# Patient Record
Sex: Female | Born: 1990 | Race: White | Hispanic: No | Marital: Married | Smoking: Former smoker
Health system: Southern US, Community
[De-identification: ages and names within clinical notes are randomized; demographics above are authoritative.]

## PROBLEM LIST (undated history)

## (undated) DIAGNOSIS — Z789 Other specified health status: Secondary | ICD-10-CM

## (undated) HISTORY — PX: NO PAST SURGERIES: SHX2092

---

## 2013-10-17 LAB — OB RESULTS CONSOLE HEPATITIS B SURFACE ANTIGEN: Hepatitis B Surface Ag: NEGATIVE

## 2013-10-17 LAB — OB RESULTS CONSOLE ABO/RH: RH Type: POSITIVE

## 2013-10-17 LAB — OB RESULTS CONSOLE GC/CHLAMYDIA
Chlamydia: NEGATIVE
GC PROBE AMP, GENITAL: NEGATIVE

## 2013-10-17 LAB — OB RESULTS CONSOLE RPR: RPR: NONREACTIVE

## 2013-10-17 LAB — OB RESULTS CONSOLE HIV ANTIBODY (ROUTINE TESTING): HIV: NONREACTIVE

## 2013-10-17 LAB — OB RESULTS CONSOLE RUBELLA ANTIBODY, IGM: Rubella: IMMUNE

## 2014-01-17 NOTE — L&D Delivery Note (Signed)
Operative Delivery Note At 6:38 PM a healthy female was delivered via Vaginal, Vacuum Investment banker, operational(Extractor).  Presentation: vertex; Position: Right,, Occiput,, Anterior; Station: +3.  The patient reached complete dilation and pushed about 2 hours then began to make no significant progress due to fatigue.  Verbal consent: obtained from patient.  Risks and benefits discussed in detail.  Risks include, but are not limited to the risks of anesthesia, bleeding, infection, damage to maternal tissues, fetal cephalhematoma.  There is also the risk of inability to effect vaginal delivery of the head, or shoulder dystocia that cannot be resolved by established maneuvers, leading to the need for emergency cesarean section.  The softcup vacuum was applied to the green zone and over 3 sets of pushes the vertex delivered to crowning with one pop-off.  The patient then pushed out remainder of head.  APGAR: 9, 9; weight pendiung .   Placenta status: Intact, Spontaneous.   Cord: 3 vessels with the following complications: None.    Anesthesia: Epidural  Instruments: Soft cup vacuum Episiotomy:  none Lacerations: Perineal;Right Labial;2nd degree Suture Repair: 3.0 vicryl rapide Est. Blood Loss (mL): 273mL  Mom to postpartum.  Baby to Couplet care / Skin to Skin.  Leah Hudson,Leah Hudson 05/09/2014, 7:07 PM

## 2014-04-16 LAB — OB RESULTS CONSOLE GBS: GBS: POSITIVE

## 2014-05-08 ENCOUNTER — Encounter (HOSPITAL_COMMUNITY): Payer: Self-pay | Admitting: *Deleted

## 2014-05-08 ENCOUNTER — Inpatient Hospital Stay (HOSPITAL_COMMUNITY)
Admission: AD | Admit: 2014-05-08 | Discharge: 2014-05-11 | DRG: 775 | Disposition: A | Discharge status: Home / Self Care | Payer: No Typology Code available for payment source | Source: Ambulatory Visit | Attending: Obstetrics and Gynecology | Admitting: Obstetrics and Gynecology

## 2014-05-08 DIAGNOSIS — Z3A39 39 weeks gestation of pregnancy: Secondary | ICD-10-CM | POA: Diagnosis present

## 2014-05-08 DIAGNOSIS — Z3490 Encounter for supervision of normal pregnancy, unspecified, unspecified trimester: Secondary | ICD-10-CM

## 2014-05-08 DIAGNOSIS — Z833 Family history of diabetes mellitus: Secondary | ICD-10-CM

## 2014-05-08 DIAGNOSIS — O99824 Streptococcus B carrier state complicating childbirth: Secondary | ICD-10-CM | POA: Diagnosis present

## 2014-05-08 DIAGNOSIS — O133 Gestational [pregnancy-induced] hypertension without significant proteinuria, third trimester: Principal | ICD-10-CM | POA: Diagnosis present

## 2014-05-08 DIAGNOSIS — Z87891 Personal history of nicotine dependence: Secondary | ICD-10-CM

## 2014-05-08 HISTORY — DX: Other specified health status: Z78.9

## 2014-05-08 LAB — COMPREHENSIVE METABOLIC PANEL
ALBUMIN: 2.8 g/dL — AB (ref 3.5–5.2)
ALK PHOS: 181 U/L — AB (ref 39–117)
ALT: 17 U/L (ref 0–35)
AST: 25 U/L (ref 0–37)
Anion gap: 9 (ref 5–15)
BUN: 9 mg/dL (ref 6–23)
CO2: 17 mmol/L — ABNORMAL LOW (ref 19–32)
Calcium: 8.8 mg/dL (ref 8.4–10.5)
Chloride: 108 mmol/L (ref 96–112)
Creatinine, Ser: 0.56 mg/dL (ref 0.50–1.10)
GFR calc Af Amer: >90 mL/min (ref >90)
GFR calc non Af Amer: >90 mL/min (ref >90)
Glucose, Bld: 94 mg/dL (ref 70–99)
POTASSIUM: 4 mmol/L (ref 3.5–5.1)
SODIUM: 134 mmol/L — AB (ref 135–145)
Total Bilirubin: 0.5 mg/dL (ref 0.3–1.2)
Total Protein: 6.3 g/dL (ref 6.0–8.3)

## 2014-05-08 LAB — TYPE AND SCREEN
ABO/RH(D): A POS
ANTIBODY SCREEN: NEGATIVE

## 2014-05-08 LAB — CBC
HCT: 35.4 % — ABNORMAL LOW (ref 36.0–46.0)
HEMOGLOBIN: 12 g/dL (ref 12.0–15.0)
MCH: 29.3 pg (ref 26.0–34.0)
MCHC: 33.9 g/dL (ref 30.0–36.0)
MCV: 86.6 fL (ref 78.0–100.0)
Platelets: 207 10*3/uL (ref 150–400)
RBC: 4.09 MIL/uL (ref 3.87–5.11)
RDW: 14.3 % (ref 11.5–15.5)
WBC: 8.4 10*3/uL (ref 4.0–10.5)

## 2014-05-08 LAB — ABO/RH: ABO/RH(D): A POS

## 2014-05-08 MED ORDER — MISOPROSTOL 25 MCG QUARTER TABLET
25.0000 ug | ORAL_TABLET | ORAL | Status: DC | PRN
  Administered 2014-05-08 (×2): 25 ug via VAGINAL
  Filled 2014-05-08: qty 0.25
  Filled 2014-05-08: qty 1
  Filled 2014-05-08: qty 0.25

## 2014-05-08 MED ORDER — CITRIC ACID-SODIUM CITRATE 334-500 MG/5ML PO SOLN: 30.0000 mL | ORAL | Status: DC | PRN

## 2014-05-08 MED ORDER — OXYTOCIN 40 UNITS IN LACTATED RINGERS INFUSION - SIMPLE MED
1.0000 m[IU]/min | INTRAVENOUS | Status: DC
  Administered 2014-05-08: 1 m[IU]/min via INTRAVENOUS
  Filled 2014-05-08: qty 1000

## 2014-05-08 MED ORDER — ONDANSETRON HCL 4 MG/2ML IJ SOLN: 4.0000 mg | Freq: Four times a day (QID) | INTRAMUSCULAR | Status: DC | PRN

## 2014-05-08 MED ORDER — OXYTOCIN 40 UNITS IN LACTATED RINGERS INFUSION - SIMPLE MED: 62.5000 mL/h | INTRAVENOUS | Status: DC

## 2014-05-08 MED ORDER — TERBUTALINE SULFATE 1 MG/ML IJ SOLN
0.2500 mg | Freq: Once | INTRAMUSCULAR | Status: AC | PRN
Start: 2014-05-08 — End: 2014-05-08

## 2014-05-08 MED ORDER — OXYCODONE-ACETAMINOPHEN 5-325 MG PO TABS
1.0000 | ORAL_TABLET | ORAL | Status: DC | PRN
Start: 2014-05-08 — End: 2014-05-09

## 2014-05-08 MED ORDER — ACETAMINOPHEN 325 MG PO TABS: 650.0000 mg | ORAL_TABLET | ORAL | Status: DC | PRN

## 2014-05-08 MED ORDER — FLEET ENEMA 7-19 GM/118ML RE ENEM: 1.0000 | ENEMA | RECTAL | Status: DC | PRN

## 2014-05-08 MED ORDER — LACTATED RINGERS IV SOLN
INTRAVENOUS | Status: DC
Start: 2014-05-08 — End: 2014-05-08
  Administered 2014-05-08: 12:00:00 via INTRAVENOUS

## 2014-05-08 MED ORDER — TERBUTALINE SULFATE 1 MG/ML IJ SOLN: 0.2500 mg | Freq: Once | INTRAMUSCULAR | Status: AC | PRN

## 2014-05-08 MED ORDER — LACTATED RINGERS IV SOLN: 500.0000 mL | INTRAVENOUS | Status: DC | PRN

## 2014-05-08 MED ORDER — LACTATED RINGERS IV SOLN
INTRAVENOUS | Status: DC
  Administered 2014-05-08 – 2014-05-09 (×3): via INTRAVENOUS

## 2014-05-08 MED ORDER — LIDOCAINE HCL (PF) 1 % IJ SOLN: 30.0000 mL | INTRAMUSCULAR | Status: DC | PRN

## 2014-05-08 MED ORDER — OXYTOCIN BOLUS FROM INFUSION: 500.0000 mL | INTRAVENOUS | Status: DC

## 2014-05-08 MED ORDER — LABETALOL HCL 5 MG/ML IV SOLN: 20.0000 mg | INTRAVENOUS | Status: DC | PRN

## 2014-05-08 MED ORDER — OXYCODONE-ACETAMINOPHEN 5-325 MG PO TABS: 1.0000 | ORAL_TABLET | ORAL | Status: DC | PRN

## 2014-05-08 MED ORDER — OXYCODONE-ACETAMINOPHEN 5-325 MG PO TABS: 2.0000 | ORAL_TABLET | ORAL | Status: DC | PRN

## 2014-05-08 MED ORDER — BUTORPHANOL TARTRATE 1 MG/ML IJ SOLN
1.0000 mg | INTRAMUSCULAR | Status: DC | PRN
  Administered 2014-05-08 (×2): 1 mg via INTRAVENOUS
  Filled 2014-05-08 (×2): qty 1

## 2014-05-08 MED ORDER — DEXTROSE 5 % IV SOLN
2.5000 10*6.[IU] | INTRAVENOUS | Status: DC
  Administered 2014-05-08 – 2014-05-09 (×6): 2.5 10*6.[IU] via INTRAVENOUS
  Filled 2014-05-08 (×10): qty 2.5

## 2014-05-08 MED ORDER — HYDRALAZINE HCL 20 MG/ML IJ SOLN: 10.0000 mg | Freq: Once | INTRAMUSCULAR | Status: AC | PRN

## 2014-05-08 MED ORDER — PENICILLIN G POTASSIUM 5000000 UNITS IJ SOLR
5.0000 10*6.[IU] | Freq: Once | INTRAVENOUS | Status: AC
  Administered 2014-05-08: 5 10*6.[IU] via INTRAVENOUS
  Filled 2014-05-08: qty 5

## 2014-05-08 NOTE — Progress Notes (Signed)
Cytotec placed, cervix unchanged, foley in place

## 2014-05-08 NOTE — H&P (Signed)
Leah Hudson is a 24 y.o. female, G1P0, EGA 39+ weeks with EDC 4-27 presenting for ripening and induction for PIH.  Prenatal care uncomplicated until BP yesterday 150/90 x 2, no proteinuria.  Seen again today, BP 160/90.  No PIH symptoms, pregnancy otherwise uncomplicated, see prenatal records for complete history.  Maternal Medical History:  Fetal activity: Perceived fetal activity is normal.    Prenatal complications: PIH.     OB History    Gravida Para Term Preterm AB TAB SAB Ectopic Multiple Living   1              Past Medical History  Diagnosis Date  . Medical history non-contributory    Past Surgical History  Procedure Laterality Date  . No past surgeries     Family History: family history includes Diabetes in her maternal grandmother; Varicose Veins in her maternal grandmother and mother. Social History:  reports that she has quit smoking. Her smoking use included Cigarettes. She has a 1.5 pack-year smoking history. She does not have any smokeless tobacco history on file. She reports that she does not drink alcohol or use illicit drugs.   Prenatal Transfer Tool  Maternal Diabetes: No Genetic Screening: Declined Maternal Ultrasounds/Referrals: Normal Fetal Ultrasounds or other Referrals:  None Maternal Substance Abuse:  No Significant Maternal Medications:  None Significant Maternal Lab Results:  Lab values include: Group B Strep positive Other Comments:  PIH  Review of Systems  Respiratory: Negative.   Cardiovascular: Negative.       Blood pressure 148/82, pulse 92, temperature 98.5 F (36.9 C), temperature source Oral, resp. rate 18, height 5\' 8"  (1.727 m), weight 115.214 kg (254 lb). Maternal Exam:  Abdomen: Patient reports no abdominal tenderness. Estimated fetal weight is 7-8 lbs.   Fetal presentation: vertex  Introitus: Normal vulva. Normal vagina.  Amniotic fluid character: not assessed.  Pelvis: adequate for delivery.   Cervix: Cervix evaluated by  digital exam.     Fetal Exam Fetal Monitor Review: Mode: ultrasound.   Baseline rate: 140.  Variability: moderate (6-25 bpm).   Pattern: accelerations present and no decelerations.    Fetal State Assessment: Category I - tracings are normal.     Physical Exam  Vitals reviewed. Constitutional: She appears well-developed and well-nourished.  Cardiovascular: Normal rate, regular rhythm and normal heart sounds.   No murmur heard. Respiratory: Effort normal and breath sounds normal. No respiratory distress. She has no wheezes.  GI: Soft.   VE-FT/50/-3, vtx, foley placed through cervix and bulb inflated with 60 cc fluid  Prenatal labs: ABO, Rh:  A pos Antibody:  neg Rubella:  Immune RPR:   NR HBsAg:   Neg HIV:   NR GBS:   Pos GCT:  149, nl GTT  Assessment/Plan: IUP at 39 weeks with PIH, unfavorable cervix.  Will check PIH labs, foley placed in cervix and will do cytotec for ripening, monitor BP and treat if needed.   Leah Hudson D 05/08/2014, 12:33 PM

## 2014-05-08 NOTE — Progress Notes (Signed)
Feeling some cramping Afeb, VSS, BP 130-140/80-90 FHT- Cat I Labs all normal Continue ripening

## 2014-05-09 ENCOUNTER — Encounter (HOSPITAL_COMMUNITY): Payer: Self-pay

## 2014-05-09 ENCOUNTER — Inpatient Hospital Stay (HOSPITAL_COMMUNITY): Payer: No Typology Code available for payment source | Admitting: Anesthesiology

## 2014-05-09 LAB — CBC
HCT: 34.2 % — ABNORMAL LOW (ref 36.0–46.0)
HEMATOCRIT: 35.8 % — AB (ref 36.0–46.0)
HEMOGLOBIN: 11.7 g/dL — AB (ref 12.0–15.0)
HEMOGLOBIN: 12 g/dL (ref 12.0–15.0)
MCH: 29.8 pg (ref 26.0–34.0)
MCH: 29.1 pg (ref 26.0–34.0)
MCHC: 34.2 g/dL (ref 30.0–36.0)
MCHC: 33.5 g/dL (ref 30.0–36.0)
MCV: 87 fL (ref 78.0–100.0)
MCV: 86.7 fL (ref 78.0–100.0)
Platelets: 183 10*3/uL (ref 150–400)
Platelets: 190 10*3/uL (ref 150–400)
RBC: 3.93 MIL/uL (ref 3.87–5.11)
RBC: 4.13 MIL/uL (ref 3.87–5.11)
RDW: 14.4 % (ref 11.5–15.5)
RDW: 14.2 % (ref 11.5–15.5)
WBC: 9 10*3/uL (ref 4.0–10.5)
WBC: 11.7 10*3/uL — ABNORMAL HIGH (ref 4.0–10.5)

## 2014-05-09 LAB — RPR: RPR: NONREACTIVE

## 2014-05-09 MED ORDER — SENNOSIDES-DOCUSATE SODIUM 8.6-50 MG PO TABS
2.0000 | ORAL_TABLET | ORAL | Status: DC
  Administered 2014-05-09 – 2014-05-11 (×2): 2 via ORAL
  Filled 2014-05-09 (×2): qty 2

## 2014-05-09 MED ORDER — EPHEDRINE 5 MG/ML INJ
10.0000 mg | INTRAVENOUS | Status: DC | PRN
  Filled 2014-05-09: qty 2

## 2014-05-09 MED ORDER — OXYTOCIN 40 UNITS IN LACTATED RINGERS INFUSION - SIMPLE MED
1.0000 m[IU]/min | INTRAVENOUS | Status: DC
  Administered 2014-05-09: 666 m[IU]/min via INTRAVENOUS

## 2014-05-09 MED ORDER — PRENATAL MULTIVITAMIN CH
1.0000 | ORAL_TABLET | Freq: Every day | ORAL | Status: DC
  Administered 2014-05-10: 1 via ORAL
  Filled 2014-05-09: qty 1

## 2014-05-09 MED ORDER — LIDOCAINE HCL (PF) 1 % IJ SOLN
INTRAMUSCULAR | Status: AC
  Filled 2014-05-09: qty 30

## 2014-05-09 MED ORDER — ONDANSETRON HCL 4 MG PO TABS
4.0000 mg | ORAL_TABLET | ORAL | Status: DC | PRN
Start: 2014-05-09 — End: 2014-05-11

## 2014-05-09 MED ORDER — ONDANSETRON HCL 4 MG/2ML IJ SOLN: 4.0000 mg | INTRAMUSCULAR | Status: DC | PRN

## 2014-05-09 MED ORDER — LORATADINE 10 MG PO TABS
10.0000 mg | ORAL_TABLET | Freq: Every day | ORAL | Status: DC
  Administered 2014-05-11: 10 mg via ORAL
  Filled 2014-05-09: qty 1

## 2014-05-09 MED ORDER — LANOLIN HYDROUS EX OINT
TOPICAL_OINTMENT | CUTANEOUS | Status: DC | PRN
Start: 2014-05-09 — End: 2014-05-11

## 2014-05-09 MED ORDER — ACETAMINOPHEN 325 MG PO TABS: 650.0000 mg | ORAL_TABLET | ORAL | Status: DC | PRN

## 2014-05-09 MED ORDER — SIMETHICONE 80 MG PO CHEW: 80.0000 mg | CHEWABLE_TABLET | ORAL | Status: DC | PRN

## 2014-05-09 MED ORDER — TETANUS-DIPHTH-ACELL PERTUSSIS 5-2.5-18.5 LF-MCG/0.5 IM SUSP: 0.5000 mL | Freq: Once | INTRAMUSCULAR | Status: DC

## 2014-05-09 MED ORDER — DIBUCAINE 1 % RE OINT: 1.0000 "application " | TOPICAL_OINTMENT | RECTAL | Status: DC | PRN

## 2014-05-09 MED ORDER — IBUPROFEN 600 MG PO TABS
600.0000 mg | ORAL_TABLET | Freq: Four times a day (QID) | ORAL | Status: DC
  Administered 2014-05-09 – 2014-05-11 (×6): 600 mg via ORAL
  Filled 2014-05-09 (×6): qty 1

## 2014-05-09 MED ORDER — LIDOCAINE HCL (PF) 1 % IJ SOLN
INTRAMUSCULAR | Status: DC | PRN
  Administered 2014-05-09 (×2): 4 mL

## 2014-05-09 MED ORDER — ZOLPIDEM TARTRATE 5 MG PO TABS: 5.0000 mg | ORAL_TABLET | Freq: Every evening | ORAL | Status: DC | PRN

## 2014-05-09 MED ORDER — PHENYLEPHRINE 40 MCG/ML (10ML) SYRINGE FOR IV PUSH (FOR BLOOD PRESSURE SUPPORT)
80.0000 ug | PREFILLED_SYRINGE | INTRAVENOUS | Status: DC | PRN
  Filled 2014-05-09: qty 2

## 2014-05-09 MED ORDER — PHENYLEPHRINE 40 MCG/ML (10ML) SYRINGE FOR IV PUSH (FOR BLOOD PRESSURE SUPPORT)
80.0000 ug | PREFILLED_SYRINGE | INTRAVENOUS | Status: DC | PRN
  Filled 2014-05-09: qty 20
  Filled 2014-05-09: qty 2

## 2014-05-09 MED ORDER — DIPHENHYDRAMINE HCL 50 MG/ML IJ SOLN: 12.5000 mg | INTRAMUSCULAR | Status: DC | PRN

## 2014-05-09 MED ORDER — OXYCODONE-ACETAMINOPHEN 5-325 MG PO TABS: 2.0000 | ORAL_TABLET | ORAL | Status: DC | PRN

## 2014-05-09 MED ORDER — OXYCODONE-ACETAMINOPHEN 5-325 MG PO TABS: 1.0000 | ORAL_TABLET | ORAL | Status: DC | PRN

## 2014-05-09 MED ORDER — BENZOCAINE-MENTHOL 20-0.5 % EX AERO
1.0000 "application " | INHALATION_SPRAY | CUTANEOUS | Status: DC | PRN
  Administered 2014-05-10: 1 via TOPICAL
  Filled 2014-05-09: qty 56

## 2014-05-09 MED ORDER — LACTATED RINGERS IV SOLN: 500.0000 mL | Freq: Once | INTRAVENOUS | Status: DC

## 2014-05-09 MED ORDER — DIPHENHYDRAMINE HCL 25 MG PO CAPS: 25.0000 mg | ORAL_CAPSULE | Freq: Four times a day (QID) | ORAL | Status: DC | PRN

## 2014-05-09 MED ORDER — WITCH HAZEL-GLYCERIN EX PADS: 1.0000 "application " | MEDICATED_PAD | CUTANEOUS | Status: DC | PRN

## 2014-05-09 MED ORDER — FENTANYL 2.5 MCG/ML BUPIVACAINE 1/10 % EPIDURAL INFUSION (WH - ANES)
14.0000 mL/h | INTRAMUSCULAR | Status: DC | PRN
  Administered 2014-05-09 (×2): 14 mL/h via EPIDURAL
  Filled 2014-05-09 (×2): qty 125

## 2014-05-09 NOTE — Anesthesia Procedure Notes (Signed)
Epidural Patient location during procedure: OB Start time: 05/09/2014 9:45 AM  Staffing Anesthesiologist: Karie SchwalbeJUDD, Tyshon Fanning Performed by: anesthesiologist   Preanesthetic Checklist Completed: patient identified, site marked, surgical consent, pre-op evaluation, timeout performed, IV checked, risks and benefits discussed and monitors and equipment checked  Epidural Patient position: sitting Prep: site prepped and draped and DuraPrep Patient monitoring: continuous pulse ox and blood pressure Approach: midline Location: L3-L4 Injection technique: LOR saline  Needle:  Needle type: Tuohy  Needle gauge: 17 G Needle length: 9 cm and 9 Needle insertion depth: 9 cm Catheter type: closed end flexible Catheter size: 19 Gauge Catheter at skin depth: 14 cm Test dose: negative  Assessment Events: blood not aspirated, injection not painful, no injection resistance, negative IV test and no paresthesia  Additional Notes Patient identified. Risks/Benefits/Options discussed with patient including but not limited to bleeding, infection, nerve damage, paralysis, failed block, incomplete pain control, headache, blood pressure changes, nausea, vomiting, reactions to medication both or allergic, itching and postpartum back pain. Confirmed with bedside nurse the patient's most recent platelet count. Confirmed with patient that they are not currently taking any anticoagulation, have any bleeding history or any family history of bleeding disorders. Patient expressed understanding and wished to proceed. All questions were answered. Sterile technique was used throughout the entire procedure. Please see nursing notes for vital signs. Test dose was given through epidural catheter and negative prior to continuing to dose epidural or start infusion. Warning signs of high block given to the patient including shortness of breath, tingling/numbness in hands, complete motor block, or any concerning symptoms with instructions to  call for help. Patient was given instructions on fall risk and not to get out of bed. All questions and concerns addressed with instructions to call with any issues or inadequate analgesia.

## 2014-05-09 NOTE — Progress Notes (Signed)
Patient ID: Denton MeekJessica L Hudson, female   DOB: 09-Nov-1990, 24 y.o.   MRN: [id number] Pt received low dose pitocin all night, currently at 12 mu with only mild contractions afeb BP essentially WNL with a few 140/90 FHR category 1 Cervix 50/4/-2 AROM clear  D/w pt epidural when uncomfortable Has received multiple doses of PCN for GBS + status overnight. Will follow progress.

## 2014-05-09 NOTE — Anesthesia Preprocedure Evaluation (Signed)
Anesthesia Evaluation  Patient identified by MRN, date of birth, ID band Patient awake    Reviewed: Allergy & Precautions, NPO status , Patient's Chart, lab work & pertinent test results  Airway Mallampati: III  TM Distance: >3 FB Neck ROM: Full    Dental no notable dental hx. (+) Dental Advisory Given   Pulmonary former smoker,  breath sounds clear to auscultation  Pulmonary exam normal       Cardiovascular hypertension, Rhythm:Regular Rate:Normal     Neuro/Psych negative neurological ROS  negative psych ROS   GI/Hepatic negative GI ROS, Neg liver ROS,   Endo/Other  negative endocrine ROSObesity   Renal/GU negative Renal ROS  negative genitourinary   Musculoskeletal negative musculoskeletal ROS (+)   Abdominal   Peds negative pediatric ROS (+)  Hematology negative hematology ROS (+)   Anesthesia Other Findings   Reproductive/Obstetrics negative OB ROS                             Anesthesia Physical Anesthesia Plan  ASA: II  Anesthesia Plan: Epidural   Post-op Pain Management:    Induction:   Airway Management Planned:   Additional Equipment:   Intra-op Plan:   Post-operative Plan:   Informed Consent: I have reviewed the patients History and Physical, chart, labs and discussed the procedure including the risks, benefits and alternatives for the proposed anesthesia with the patient or authorized representative who has indicated his/her understanding and acceptance.   Dental advisory given  Plan Discussed with:   Anesthesia Plan Comments:         Anesthesia Quick Evaluation

## 2014-05-09 NOTE — Progress Notes (Signed)
Patient ID: Leah MeekJessica L Ripple, female   DOB: [birth year], 24 y.o.   MRN: [id number] Pt comfortable with epidural overall, a bit of pain in right lower back--but laying on left afeb vss FHR category  Cervix 80/4-5/-2  IUPC placed to adjust pitocin Pitocin at 17 mu Will increase as needed to get adequate.

## 2014-05-10 LAB — CBC
HCT: 32.7 % — ABNORMAL LOW (ref 36.0–46.0)
HEMOGLOBIN: 11.2 g/dL — AB (ref 12.0–15.0)
MCH: 29.5 pg (ref 26.0–34.0)
MCHC: 34.3 g/dL (ref 30.0–36.0)
MCV: 86.1 fL (ref 78.0–100.0)
PLATELETS: 185 10*3/uL (ref 150–400)
RBC: 3.8 MIL/uL — ABNORMAL LOW (ref 3.87–5.11)
RDW: 14.4 % (ref 11.5–15.5)
WBC: 11.6 10*3/uL — ABNORMAL HIGH (ref 4.0–10.5)

## 2014-05-10 NOTE — Lactation Note (Signed)
This note was copied from the chart of Leah Hudson. Lactation Consultation Note  Assisted mother w/ football hold.  Mother has good positioning but baby is spitty and would not sustain latch. Mother had good positioning.  Encouraged her to try again when baby cues. Mother was able to independently hand express drops of colostrum. Discussed milk storage, STS, supply, demand, cluster feeding, how to unlatch, and pacifiers. Mom encouraged to feed baby 8-12 times/24 hours and with feeding cues.  Mom made aware of O/P services, breastfeeding support groups, community resources, and our phone # for post-discharge questions.    Patient Name: Leah Joylene JohnJessica Hudson [id number]'s Date: 05/10/2014 Reason for consult: Initial assessment   Maternal Data Has patient been taught Hand Expression?: Yes Does the patient have breastfeeding experience prior to this delivery?: No  Feeding    LATCH Score/Interventions                      Lactation Tools Discussed/Used     Consult Status Consult Status: Follow-up Date: 05/11/14 Follow-up type: In-patient    Dahlia ByesBerkelhammer, Oceane Fosse [facility]oschen 05/10/2014, 1:51 PM

## 2014-05-10 NOTE — Progress Notes (Signed)
Post Partum Day 1 Subjective: no complaints and tolerating PO  Objective: Blood pressure 115/67, pulse 73, temperature 98 F (36.7 C), temperature source Oral, resp. rate 18, height 5\' 8"  (1.727 m), weight 254 lb (115.214 kg), SpO2 98 %, unknown if currently breastfeeding.  Physical Exam:  General: alert and cooperative Lochia: appropriate Uterine Fundus: firm    Recent Labs  05/09/14 1917 05/10/14 0559  HGB 12.0 11.2*  HCT 35.8* 32.7*    Assessment/Plan: Plan for discharge tomorrow  BP looking good.   LOS: 2 days   Rodolphe Edmonston W 05/10/2014, 10:40 AM

## 2014-05-10 NOTE — Discharge Summary (Signed)
Obstetric Discharge Summary Reason for Admission: induction of labor for pregnancy induced hypertension with no proteinuria Prenatal Procedures: NST Intrapartum Procedures: vacuum Postpartum Procedures: none Complications-Operative and Postpartum: right labial and small second degree perineal laceration HEMOGLOBIN  Date Value Ref Range Status  05/10/2014 11.2* 12.0 - 15.0 g/dL Final   HCT  Date Value Ref Range Status  05/10/2014 32.7* 36.0 - 46.0 % Final    Physical Exam:  General: alert and cooperative Lochia: appropriate Uterine Fundus: firm   Discharge Diagnoses: Term Pregnancy-delivered                                          PIH  Discharge Information: Date: 05/11/2014 Activity: pelvic rest Diet: routine Medications: Ibuprofen Condition: improved Instructions: refer to practice specific booklet Discharge to: home Follow-up Information    Follow up with Leah Hudson,Alaura Schippers W, MD.   Specialty:  Obstetrics and Gynecology   Why:  postpartum   Contact information:   [address] [place] [id number] [phone]       Newborn Data: Live born female  Birth Weight: 9 lb 0.9 oz (4108 g) APGAR: 9, 9  Home with mother.  Leah Hudson,Leah Hudson 05/11/2014, 10:45 AM

## 2014-05-10 NOTE — Anesthesia Postprocedure Evaluation (Signed)
Anesthesia Post Note  Patient: Leah Hudson  Procedure(s) Performed: * No procedures listed *  Anesthesia type: Epidural  Patient location: Mother/Baby  Post pain: Pain level controlled  Post assessment: Post-op Vital signs reviewed  Last Vitals:  Filed Vitals:   05/10/14 0550  BP: 115/67  Pulse: 73  Temp: 36.7 C  Resp: 18    Post vital signs: Reviewed  Level of consciousness:alert  Complications: No apparent anesthesia complications

## 2014-05-11 MED ORDER — IBUPROFEN 600 MG PO TABS: 600.0000 mg | ORAL_TABLET | Freq: Four times a day (QID) | ORAL | Status: AC

## 2014-05-11 NOTE — Lactation Note (Signed)
This note was copied from the chart of Leah Joylene JohnJessica Ewton. Lactation Consultation Note  7% weight loss. Baby cluster fed through the night and recently bf for 15 min. FOB is holding baby and she is cueing.  Mother states she will finish her breakfast and then feed baby. Mother states her nipples are starting to get sore.  Reviewed applying ebm and provided her w/ comfort gels. Reviewed engorgement care and monitoring voids/stools. Mom encouraged to feed baby 8-12 times/24 hours and with feeding cues.  Encouraged her to feed on one breast, burp and feed on the other side.  Mother has hand pump.  Patient Name: Leah Hudson [id number]'s Date: 05/11/2014 Reason for consult: Follow-up assessment   Maternal Data    Feeding Feeding Type: Breast Fed Length of feed: 15 min  LATCH Score/Interventions                      Lactation Tools Discussed/Used     Consult Status Consult Status: Complete    Hardie PulleyBerkelhammer, Sherrita Riederer Boschen 05/11/2014, 9:23 AM

## 2014-05-11 NOTE — Progress Notes (Signed)
Post Partum Day 2 Subjective: no complaints and tolerating PO  Objective: Blood pressure 135/83, pulse 70, temperature 97.9 F (36.6 C), temperature source Oral, resp. rate 18, height 5\' 8"  (1.727 m), weight 254 lb (115.214 kg), SpO2 98 %, unknown if currently breastfeeding.  Physical Exam:  General: alert and cooperative Lochia: appropriate Uterine Fundus: firm    Recent Labs  05/09/14 1917 05/10/14 0559  HGB 12.0 11.2*  HCT 35.8* 32.7*    Assessment/Plan: Discharge home   LOS: 3 days   Safaa Stingley W 05/11/2014, 9:55 AM

## 2016-07-07 ENCOUNTER — Other Ambulatory Visit: Payer: Self-pay | Admitting: Obstetrics and Gynecology

## 2016-07-07 DIAGNOSIS — N63 Unspecified lump in unspecified breast: Secondary | ICD-10-CM

## 2016-07-14 ENCOUNTER — Other Ambulatory Visit: Payer: Self-pay | Admitting: Obstetrics and Gynecology

## 2016-07-14 ENCOUNTER — Ambulatory Visit
Admission: RE | Admit: 2016-07-14 | Discharge: 2016-07-14 | Disposition: A | Discharge status: Home / Self Care | Payer: 59 | Source: Ambulatory Visit | Attending: Obstetrics and Gynecology | Admitting: Obstetrics and Gynecology

## 2016-07-14 DIAGNOSIS — N63 Unspecified lump in unspecified breast: Secondary | ICD-10-CM

## 2017-01-20 ENCOUNTER — Other Ambulatory Visit: Payer: 59

## 2017-01-27 ENCOUNTER — Other Ambulatory Visit: Payer: Self-pay | Admitting: Obstetrics and Gynecology

## 2017-01-27 ENCOUNTER — Ambulatory Visit
Admission: RE | Admit: 2017-01-27 | Discharge: 2017-01-27 | Disposition: A | Discharge status: Home / Self Care | Payer: 59 | Source: Ambulatory Visit | Attending: Obstetrics and Gynecology | Admitting: Obstetrics and Gynecology

## 2017-01-27 DIAGNOSIS — N6310 Unspecified lump in the right breast, unspecified quadrant: Secondary | ICD-10-CM

## 2017-01-27 DIAGNOSIS — N63 Unspecified lump in unspecified breast: Secondary | ICD-10-CM

## 2017-02-10 ENCOUNTER — Other Ambulatory Visit: Payer: 59

## 2017-08-11 ENCOUNTER — Ambulatory Visit
Admission: RE | Admit: 2017-08-11 | Discharge: 2017-08-11 | Disposition: A | Discharge status: Home / Self Care | Payer: 59 | Source: Ambulatory Visit | Attending: Obstetrics and Gynecology | Admitting: Obstetrics and Gynecology

## 2017-08-11 DIAGNOSIS — N6310 Unspecified lump in the right breast, unspecified quadrant: Secondary | ICD-10-CM

## 2018-07-11 DIAGNOSIS — Z1389 Encounter for screening for other disorder: Secondary | ICD-10-CM | POA: Diagnosis not present

## 2018-07-11 DIAGNOSIS — Z130 Encounter for screening for diseases of the blood and blood-forming organs and certain disorders involving the immune mechanism: Secondary | ICD-10-CM | POA: Diagnosis not present

## 2018-07-11 DIAGNOSIS — Z6831 Body mass index (BMI) 31.0-31.9, adult: Secondary | ICD-10-CM | POA: Diagnosis not present

## 2018-07-11 DIAGNOSIS — Z3041 Encounter for surveillance of contraceptive pills: Secondary | ICD-10-CM | POA: Diagnosis not present

## 2018-07-11 DIAGNOSIS — Z01419 Encounter for gynecological examination (general) (routine) without abnormal findings: Secondary | ICD-10-CM | POA: Diagnosis not present

## 2018-07-11 DIAGNOSIS — Z124 Encounter for screening for malignant neoplasm of cervix: Secondary | ICD-10-CM | POA: Diagnosis not present

## 2018-07-11 DIAGNOSIS — Z1151 Encounter for screening for human papillomavirus (HPV): Secondary | ICD-10-CM | POA: Diagnosis not present

## 2018-08-16 DIAGNOSIS — Z30430 Encounter for insertion of intrauterine contraceptive device: Secondary | ICD-10-CM | POA: Diagnosis not present

## 2018-08-17 ENCOUNTER — Other Ambulatory Visit: Payer: Self-pay | Admitting: Obstetrics and Gynecology

## 2018-08-17 DIAGNOSIS — N6310 Unspecified lump in the right breast, unspecified quadrant: Secondary | ICD-10-CM

## 2018-08-23 DIAGNOSIS — Z13220 Encounter for screening for lipoid disorders: Secondary | ICD-10-CM | POA: Diagnosis not present

## 2018-08-23 DIAGNOSIS — Z6830 Body mass index (BMI) 30.0-30.9, adult: Secondary | ICD-10-CM | POA: Diagnosis not present

## 2018-08-23 DIAGNOSIS — Z131 Encounter for screening for diabetes mellitus: Secondary | ICD-10-CM | POA: Diagnosis not present

## 2018-08-23 DIAGNOSIS — Z0000 Encounter for general adult medical examination without abnormal findings: Secondary | ICD-10-CM | POA: Diagnosis not present

## 2018-08-28 ENCOUNTER — Ambulatory Visit: Payer: 59

## 2018-08-28 ENCOUNTER — Ambulatory Visit
Admission: RE | Admit: 2018-08-28 | Discharge: 2018-08-28 | Disposition: A | Discharge status: Home / Self Care | Payer: 59 | Source: Ambulatory Visit | Attending: Obstetrics and Gynecology | Admitting: Obstetrics and Gynecology

## 2018-08-28 ENCOUNTER — Other Ambulatory Visit: Payer: Self-pay

## 2018-08-28 DIAGNOSIS — N6310 Unspecified lump in the right breast, unspecified quadrant: Secondary | ICD-10-CM

## 2018-08-28 DIAGNOSIS — R922 Inconclusive mammogram: Secondary | ICD-10-CM | POA: Diagnosis not present

## 2018-09-21 DIAGNOSIS — Z30431 Encounter for routine checking of intrauterine contraceptive device: Secondary | ICD-10-CM | POA: Diagnosis not present

## 2018-09-21 DIAGNOSIS — R1011 Right upper quadrant pain: Secondary | ICD-10-CM | POA: Diagnosis not present

## 2019-07-17 DIAGNOSIS — Z30431 Encounter for routine checking of intrauterine contraceptive device: Secondary | ICD-10-CM | POA: Diagnosis not present

## 2019-07-17 DIAGNOSIS — Z6832 Body mass index (BMI) 32.0-32.9, adult: Secondary | ICD-10-CM | POA: Diagnosis not present

## 2019-07-17 DIAGNOSIS — Z1389 Encounter for screening for other disorder: Secondary | ICD-10-CM | POA: Diagnosis not present

## 2019-07-17 DIAGNOSIS — Z7689 Persons encountering health services in other specified circumstances: Secondary | ICD-10-CM | POA: Diagnosis not present

## 2019-07-17 DIAGNOSIS — Z130 Encounter for screening for diseases of the blood and blood-forming organs and certain disorders involving the immune mechanism: Secondary | ICD-10-CM | POA: Diagnosis not present

## 2019-07-17 DIAGNOSIS — Z01419 Encounter for gynecological examination (general) (routine) without abnormal findings: Secondary | ICD-10-CM | POA: Diagnosis not present

## 2019-08-19 DIAGNOSIS — Z6831 Body mass index (BMI) 31.0-31.9, adult: Secondary | ICD-10-CM | POA: Diagnosis not present

## 2021-03-01 IMAGING — MG DIGITAL DIAGNOSTIC BILATERAL MAMMOGRAM WITH TOMO AND CAD
8 series · 8 of 24 positions shown · non-contrast
Comparison: 08/11/2017, 01/27/2017, 07/14/2016, and 10/09/2009

CLINICAL DATA: Patient returns for short interval follow-up of the
palpable lump in the RIGHT breast. She reports no interval changes.
Patient was evaluated for a palpable mass in June 2016.

EXAM:
DIGITAL DIAGNOSTIC BILATERAL MAMMOGRAM WITH CAD AND TOMO

[L CC synth-2D]
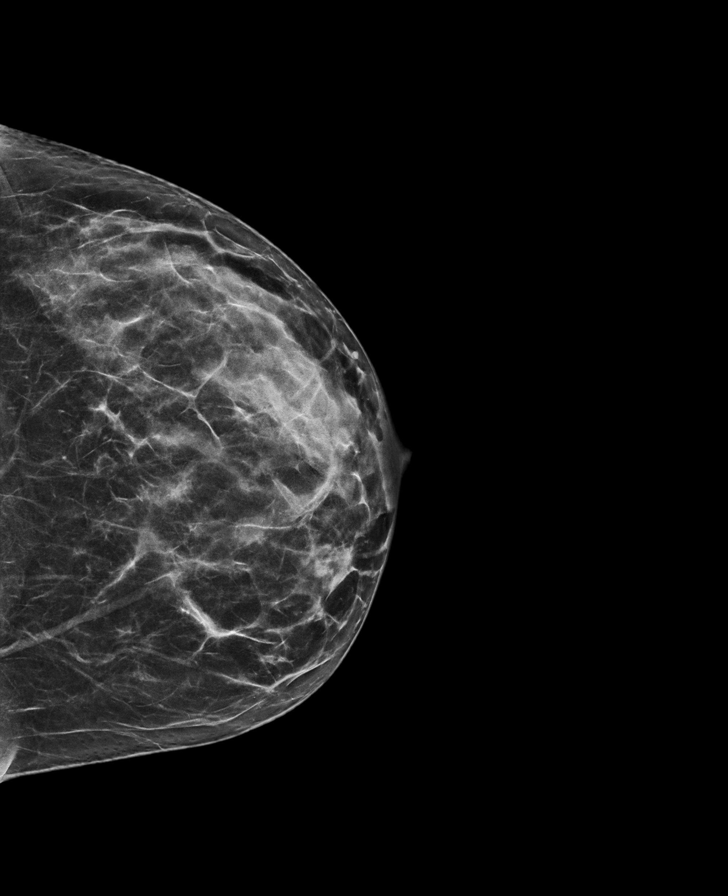

[R CC synth-2D]
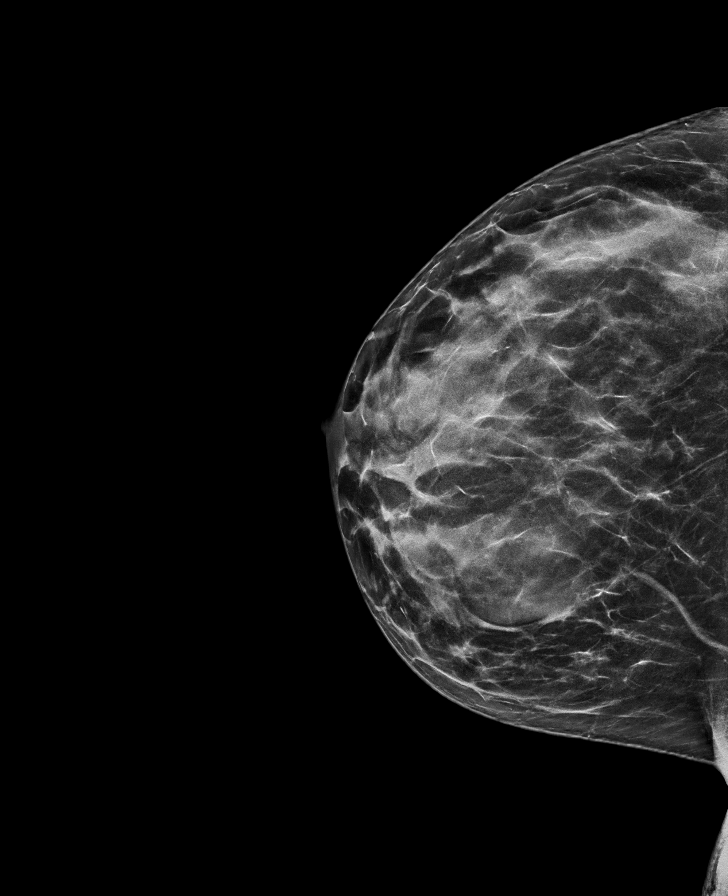

[R MLO synth-2D]
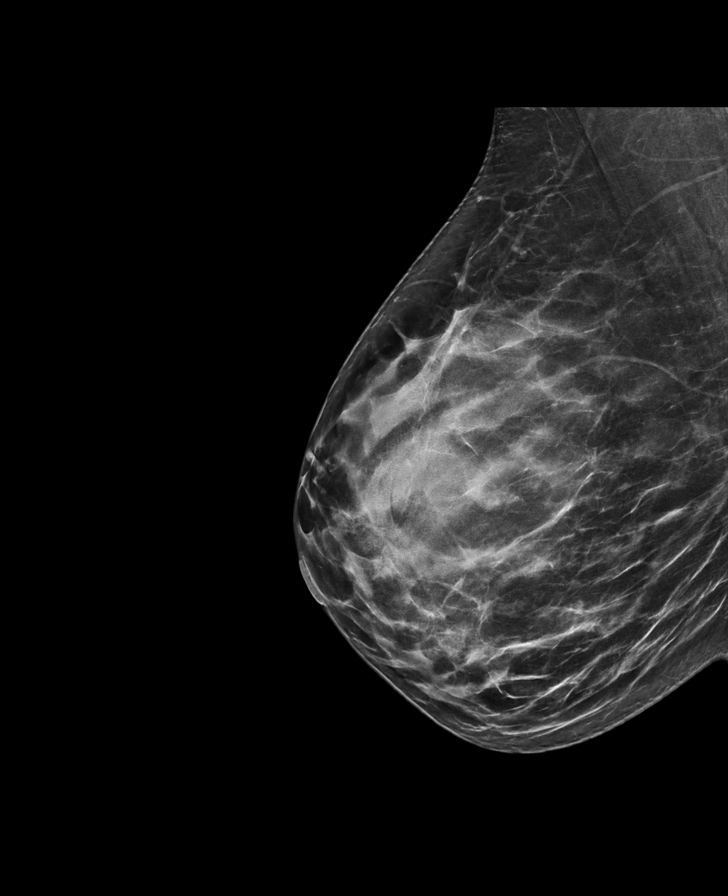

[L MLO synth-2D]
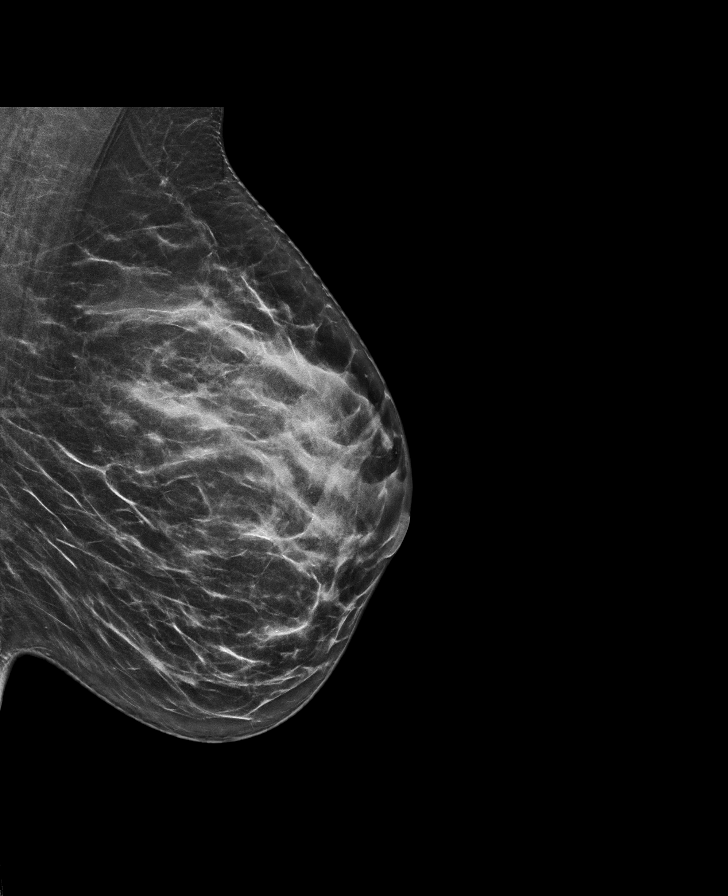

[L CC tomo · tomo slice 33/65.0]
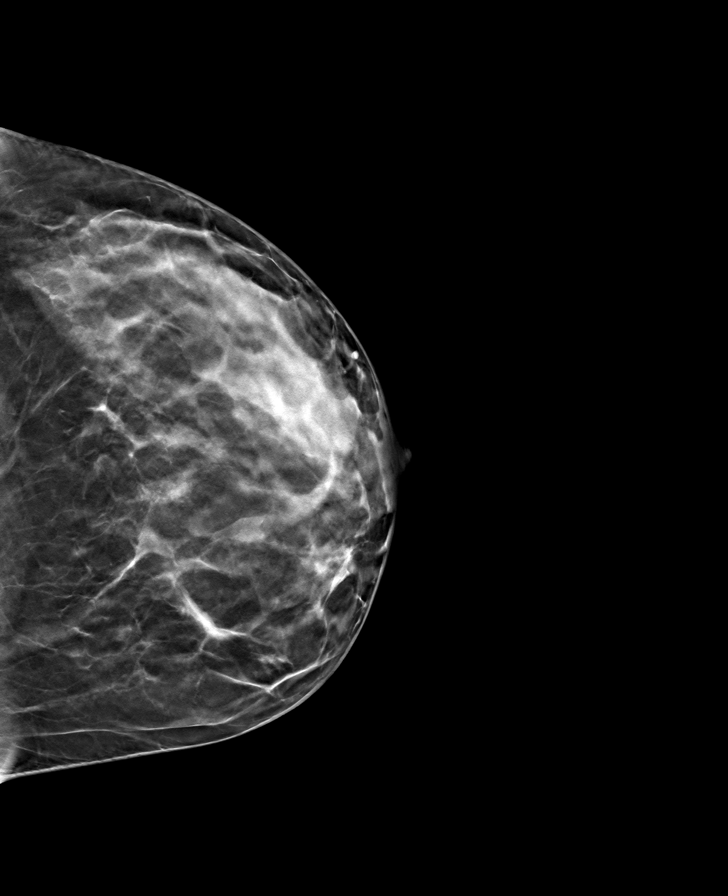

[L MLO tomo · tomo slice 34/67.0]
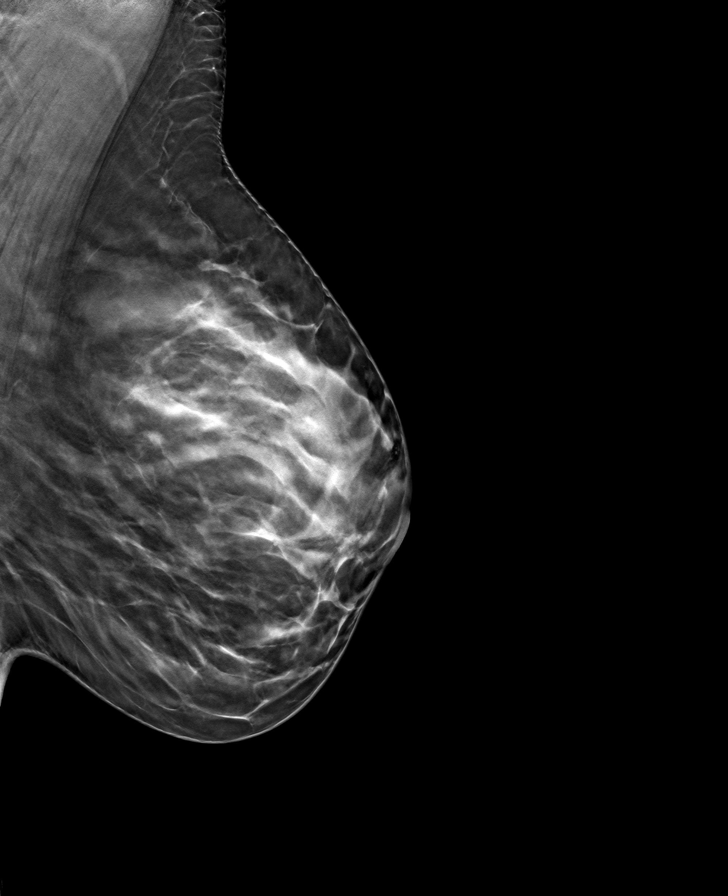

[R MLO tomo · tomo slice 35/68.0]
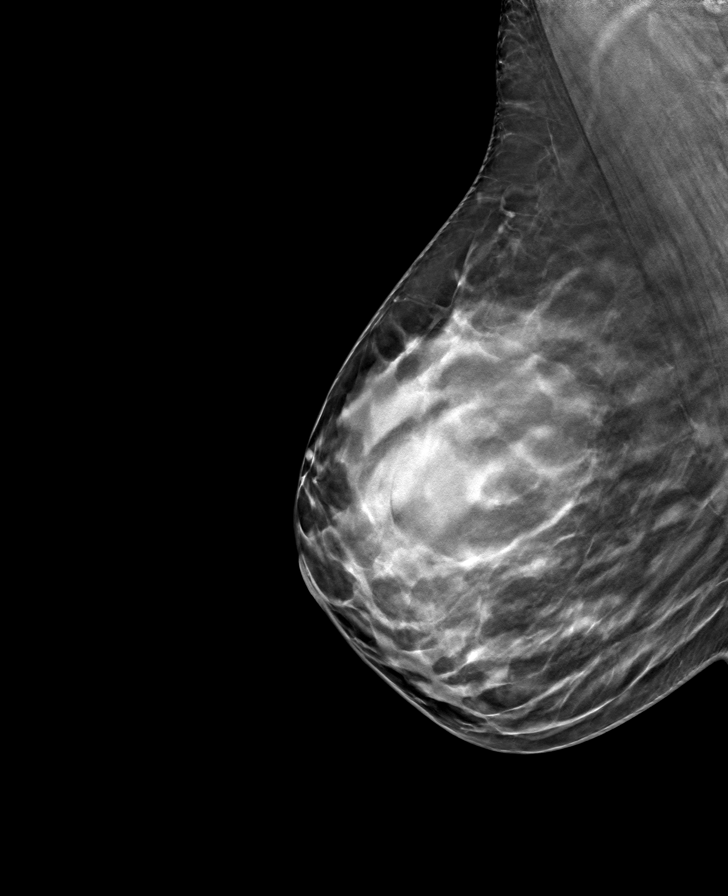

[R CC tomo · tomo slice 32/63.0]
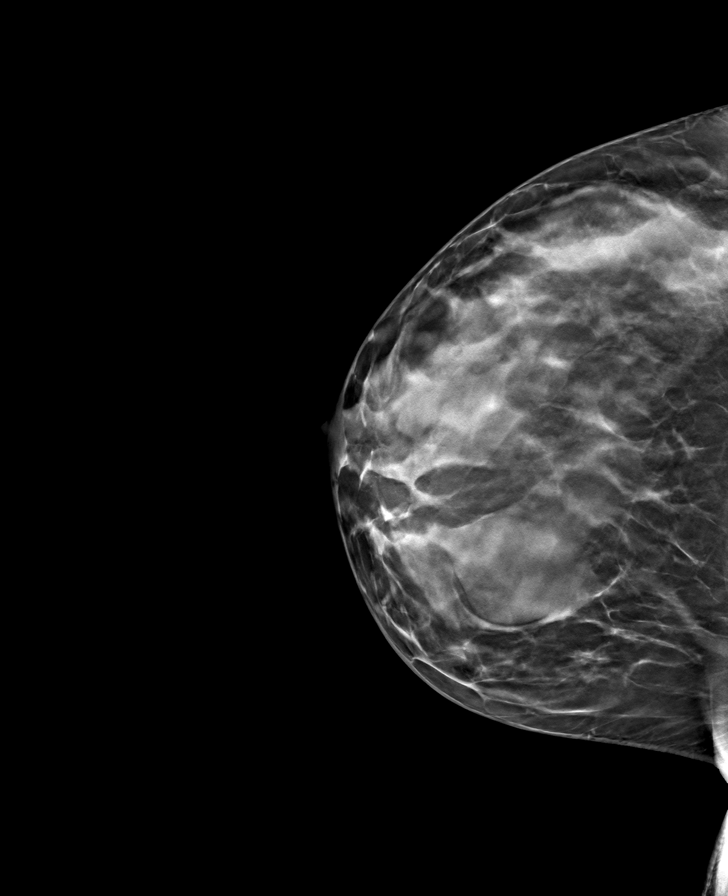

[8 of 24 positions shown; findings below may reference images not displayed]

ACR Breast Density Category c: The breast tissue is heterogeneously
dense, which may obscure small masses.
FINDINGS: Within the UPPER INNER QUADRANT of the RIGHT breast there is a
circumscribed oval mass with subcapsular lucency, consistent with
benign fibroadenolipoma. This correlates well with the mass in the 1
o'clock location of the RIGHT breast, previously evaluated with
ultrasound. The LEFT breast is negative.

Mammographic images were processed with CAD.
IMPRESSION: Benign fibroadenolipoma in the RIGHT breast. No mammographic
evidence for malignancy.

RECOMMENDATION:
Screening mammogram at age 38 unless there are persistent or
intervening clinical concerns. (Code:[id number]-[id number])

I have discussed the findings and recommendations with the patient.
Results were also provided in writing at the conclusion of the
visit. If applicable, a reminder letter will be sent to the patient
regarding the next appointment.

BI-RADS CATEGORY  2: Benign.
